# Patient Record
Sex: Male | Born: 1995 | Hispanic: Yes | Marital: Single | Smoking: Never smoker
Health system: Southern US, Community
[De-identification: ages and names within clinical notes are randomized; demographics above are authoritative.]

---

## 2020-05-08 ENCOUNTER — Encounter (HOSPITAL_BASED_OUTPATIENT_CLINIC_OR_DEPARTMENT_OTHER): Payer: Self-pay | Admitting: Emergency Medicine

## 2020-05-08 ENCOUNTER — Emergency Department (HOSPITAL_BASED_OUTPATIENT_CLINIC_OR_DEPARTMENT_OTHER)
Admission: EM | Admit: 2020-05-08 | Discharge: 2020-05-08 | Disposition: A | Discharge status: Home / Self Care | Payer: Self-pay | Attending: Emergency Medicine | Admitting: Emergency Medicine

## 2020-05-08 ENCOUNTER — Other Ambulatory Visit: Payer: Self-pay

## 2020-05-08 DIAGNOSIS — N342 Other urethritis: Secondary | ICD-10-CM | POA: Insufficient documentation

## 2020-05-08 LAB — URINALYSIS, ROUTINE W REFLEX MICROSCOPIC
Bilirubin Urine: NEGATIVE
Glucose, UA: NEGATIVE mg/dL
Hgb urine dipstick: NEGATIVE
Ketones, ur: NEGATIVE mg/dL
Leukocytes,Ua: NEGATIVE
Nitrite: NEGATIVE
Protein, ur: NEGATIVE mg/dL
Specific Gravity, Urine: <1.005 — ABNORMAL LOW (ref 1.005–1.030)
pH: 5.5 (ref 5.0–8.0)

## 2020-05-08 LAB — HIV ANTIBODY (ROUTINE TESTING W REFLEX): HIV Screen 4th Generation wRfx: NONREACTIVE

## 2020-05-08 NOTE — ED Triage Notes (Signed)
Pt would like to be tested for a STD  Pt states he has discomfort with urination

## 2020-05-08 NOTE — ED Provider Notes (Signed)
  [facility] EMERGENCY DEPARTMENT Provider Note   CSN: [id number] Arrival date & time: 05/08/20  5366     History Chief Complaint  Patient presents with  . Dysuria    Tommy Moses is a 24 y.o. male.  The history is provided by the patient.  Dysuria Presenting symptoms: dysuria   Presenting symptoms: no penile discharge, no scrotal pain and no swelling   Relieved by:  Nothing Worsened by:  Nothing Associated symptoms: no fever   Patient here for STD check        PMH-none Family History  Problem Relation Age of Onset  . Diabetes Other   . Hypertension Other     Social History   Tobacco Use  . Smoking status: Never Smoker  . Smokeless tobacco: Never Used  Vaping Use  . Vaping Use: Never used  Substance Use Topics  . Alcohol use: Never  . Drug use: Yes    Types: Marijuana    Home Medications Prior to Admission medications   Not on File    Allergies    Patient has no known allergies.  Review of Systems   Review of Systems  Constitutional: Negative for fever.  Genitourinary: Positive for dysuria. Negative for discharge and genital sores.    Physical Exam Updated Vital Signs BP 117/71 (BP Location: Right Arm)   Pulse (!) 57   Temp 98.2 F (36.8 C) (Oral)   Resp 14   Ht 1.753 m (5\' 9" )   Wt 68 kg   SpO2 100%   BMI 22.15 kg/m   Physical Exam CONSTITUTIONAL: Well developed/well nourished HEAD: Normocephalic/atraumatic EYES: EOMI ENMT: Mucous membranes moist NECK: supple no meningeal signs LUNGS:   no apparent distress ABDOMEN: soft GU:no penile lesions.  No penile discharge.  No scrotal tenderness.  Nurse present for exam NEURO: Pt is awake/alert/appropriate, moves all extremitiesx4.  No facial droop.   EXTREMITIES:   full ROM SKIN: warm, color normal PSYCH: no abnormalities of mood noted, alert and oriented to situation  ED Results / Procedures / Treatments   Labs (all labs ordered are listed, but only abnormal  results are displayed) Labs Reviewed  URINALYSIS, ROUTINE W REFLEX MICROSCOPIC  HIV ANTIBODY (ROUTINE TESTING W REFLEX)  RPR  GC/CHLAMYDIA PROBE AMP ([place]) NOT AT [facility]    EKG None  Radiology No results found.  Procedures Procedures   Medications Ordered in ED Medications - No data to display  ED Course  I have reviewed the triage vital signs and the nursing notes.     MDM Rules/Calculators/A&P                          No clinical signs present to empirically treat for STD Discussed strict safe sex practices  Final Clinical Impression(s) / ED Diagnoses Final diagnoses:  Urethritis    Rx / DC Orders ED Discharge Orders    None       [facility], MD 05/08/20 [phone]

## 2020-05-09 LAB — GC/CHLAMYDIA PROBE AMP (~~LOC~~) NOT AT ARMC
Chlamydia: NEGATIVE
Comment: NEGATIVE
Comment: NORMAL
Neisseria Gonorrhea: NEGATIVE

## 2020-05-09 LAB — RPR: RPR Ser Ql: NONREACTIVE

## 2020-07-27 ENCOUNTER — Emergency Department (HOSPITAL_BASED_OUTPATIENT_CLINIC_OR_DEPARTMENT_OTHER)
Admission: EM | Admit: 2020-07-27 | Discharge: 2020-07-27 | Disposition: A | Discharge status: Home / Self Care | Payer: Self-pay | Attending: Emergency Medicine | Admitting: Emergency Medicine

## 2020-07-27 ENCOUNTER — Other Ambulatory Visit: Payer: Self-pay

## 2020-07-27 ENCOUNTER — Encounter (HOSPITAL_BASED_OUTPATIENT_CLINIC_OR_DEPARTMENT_OTHER): Payer: Self-pay

## 2020-07-27 DIAGNOSIS — A749 Chlamydial infection, unspecified: Secondary | ICD-10-CM | POA: Insufficient documentation

## 2020-07-27 DIAGNOSIS — Z202 Contact with and (suspected) exposure to infections with a predominantly sexual mode of transmission: Secondary | ICD-10-CM | POA: Insufficient documentation

## 2020-07-27 MED ORDER — CEFTRIAXONE SODIUM 500 MG IJ SOLR: 500.0000 mg | Freq: Once | INTRAMUSCULAR | Status: AC

## 2020-07-27 MED ORDER — AZITHROMYCIN 250 MG PO TABS: 1000.0000 mg | ORAL_TABLET | Freq: Once | ORAL | Status: AC

## 2020-07-27 MED ORDER — LIDOCAINE HCL (PF) 1 % IJ SOLN: 1.0000 mL | Freq: Once | INTRAMUSCULAR | Status: DC

## 2020-07-27 MED ORDER — AZITHROMYCIN 250 MG PO TABS
ORAL_TABLET | ORAL | Status: AC
  Administered 2020-07-27: 1000 mg via ORAL
  Filled 2020-07-27: qty 4

## 2020-07-27 MED ORDER — CEFTRIAXONE SODIUM 500 MG IJ SOLR
INTRAMUSCULAR | Status: AC
  Administered 2020-07-27: 500 mg via INTRAMUSCULAR
  Filled 2020-07-27: qty 500

## 2020-07-27 NOTE — ED Notes (Signed)
ED Provider at bedside. 

## 2020-07-27 NOTE — ED Triage Notes (Addendum)
Pt requesting treatment for STD-has copy of independent lab results in hand-NAD-steady gait

## 2020-07-27 NOTE — Discharge Instructions (Addendum)
For sexual intercourse until cleared by your physicians.  Contact sexual partners you have had while you had symptoms due to concern for possible infection as well.  Return if you have fevers discharge worsening symptoms or any additional concerns.

## 2020-07-27 NOTE — ED Provider Notes (Signed)
[facility] EMERGENCY DEPARTMENT Provider Note   CSN: [id number] Arrival date & time: 07/27/20  1233     History Chief Complaint  Patient presents with  . SEXUALLY TRANSMITTED DISEASE    Tommy Moses is a 25 y.o. male.  Patient presents with concern for STD.  He states that he has had some discharge and irritation in the penile region over the course of the last 2 or 3 days.  He went to an outside clinic and had multiple tests done.  He states that he was positive for chlamydia and sent to the ER for treatment.  Denies any pain or discharge at this time.  No fever no vomiting or cough or diarrhea.  He states he has not had sexual intercourse within the past week.        History reviewed. No pertinent past medical history.  There are no problems to display for this patient.   History reviewed. No pertinent surgical history.     Family History  Problem Relation Age of Onset  . Diabetes Other   . Hypertension Other     Social History   Tobacco Use  . Smoking status: Never Smoker  . Smokeless tobacco: Never Used  Vaping Use  . Vaping Use: Never used  Substance Use Topics  . Alcohol use: Never  . Drug use: Yes    Types: Marijuana    Home Medications Prior to Admission medications   Not on File    Allergies    Patient has no known allergies.  Review of Systems   Review of Systems  Constitutional: Negative for fever.  HENT: Negative for ear pain and sore throat.   Eyes: Negative for pain.  Respiratory: Negative for cough.   Cardiovascular: Negative for chest pain.  Gastrointestinal: Negative for abdominal pain.  Genitourinary: Negative for flank pain.  Musculoskeletal: Negative for back pain.  Skin: Negative for color change and rash.  Neurological: Negative for syncope.  All other systems reviewed and are negative.   Physical Exam Updated Vital Signs BP 115/75 (BP Location: Left Arm)   Pulse (!) 55   Temp 98.5 F (36.9 C) (Oral)    Resp 16   Ht 5\' 9"  (1.753 m)   Wt 64.9 kg   SpO2 100%   BMI 21.12 kg/m   Physical Exam Constitutional:      General: He is not in acute distress.    Appearance: He is well-developed.  HENT:     Head: Normocephalic.     Nose: Nose normal.  Eyes:     Extraocular Movements: Extraocular movements intact.  Cardiovascular:     Rate and Rhythm: Normal rate.  Pulmonary:     Effort: Pulmonary effort is normal.  Genitourinary:    Penis: Normal.      Comments: No ulcerations lesions or discharge noted. Skin:    Coloration: Skin is not jaundiced.  Neurological:     Mental Status: He is alert. Mental status is at baseline.     ED Results / Procedures / Treatments   Labs (all labs ordered are listed, but only abnormal results are displayed) Labs Reviewed - No data to display  EKG None  Radiology No results found.  Procedures Procedures (including critical care time)  Medications Ordered in ED Medications  azithromycin (ZITHROMAX) tablet 1,000 mg (has no administration in time range)  cefTRIAXone (ROCEPHIN) injection 500 mg (has no administration in time range)  lidocaine (PF) (XYLOCAINE) 1 % injection 1 mL (has no administration  in time range)  azithromycin (ZITHROMAX) 250 MG tablet (has no administration in time range)  cefTRIAXone (ROCEPHIN) 500 MG injection (has no administration in time range)    ED Course  I have reviewed the triage vital signs and the nursing notes.  Pertinent labs & imaging results that were available during my care of the patient were reviewed by me and considered in my medical decision making (see chart for details).    MDM Rules/Calculators/A&P                          Patient given a gram of azithromycin here and 500 mg of Rocephin.  Advise no intercourse until cleared by his doctors.  Advised him to inform his partners of his positive status.   Final Clinical Impression(s) / ED Diagnoses Final diagnoses:  Chlamydia    Rx / DC  Orders ED Discharge Orders    None       Cheryll Cockayne, MD 07/27/20 1438

## 2020-08-04 ENCOUNTER — Encounter (HOSPITAL_BASED_OUTPATIENT_CLINIC_OR_DEPARTMENT_OTHER): Payer: Self-pay

## 2020-08-04 ENCOUNTER — Emergency Department (HOSPITAL_BASED_OUTPATIENT_CLINIC_OR_DEPARTMENT_OTHER)
Admission: EM | Admit: 2020-08-04 | Discharge: 2020-08-04 | Disposition: A | Discharge status: Home / Self Care | Payer: Self-pay | Attending: Emergency Medicine | Admitting: Emergency Medicine

## 2020-08-04 ENCOUNTER — Other Ambulatory Visit: Payer: Self-pay

## 2020-08-04 DIAGNOSIS — L0291 Cutaneous abscess, unspecified: Secondary | ICD-10-CM

## 2020-08-04 DIAGNOSIS — L0231 Cutaneous abscess of buttock: Secondary | ICD-10-CM | POA: Insufficient documentation

## 2020-08-04 MED ORDER — DOXYCYCLINE HYCLATE 100 MG PO CAPS: 100.0000 mg | ORAL_CAPSULE | Freq: Two times a day (BID) | ORAL | 0 refills | Status: DC

## 2020-08-04 NOTE — ED Triage Notes (Signed)
Pt states he has an abscess to crease between buttocks and leg. States his lymph node is swelling in his left groin. Denies fever

## 2020-08-04 NOTE — Discharge Instructions (Signed)
Please read the attached information.  You have an abscess.  Please drink plenty of water use warm compresses 4 times per day.  Please follow-up with your primary care doctor.  If you do not have a primary care doctor please follow-up with [facility] or call to establish care with a primary care doctor.

## 2020-08-04 NOTE — ED Provider Notes (Signed)
[facility] EMERGENCY DEPARTMENT Provider Note   CSN: [id number] Arrival date & time: 08/04/20  1827     History Chief Complaint  Patient presents with  . Abscess    Tommy Moses is a 25 y.o. male.  HPI Patient is 25 year old male with no past medical history presented today with an abscess/skin lesion to the crease between his buttocks and his leg right and into the gluteus muscle.  He states that he has noticed over the past 2 or 3 days.  He denies any other symptoms.  He states that it hurts when he sits on it.  He states that he has shaved this area before.  No concerns for STI.  He states that he has not had any penile discharge penile swelling or pain.  He states it is aching constant and dull.  No other associated symptoms.      History reviewed. No pertinent past medical history.  There are no problems to display for this patient.   History reviewed. No pertinent surgical history.     Family History  Problem Relation Age of Onset  . Diabetes Other   . Hypertension Other     Social History   Tobacco Use  . Smoking status: Never Smoker  . Smokeless tobacco: Never Used  Vaping Use  . Vaping Use: Never used  Substance Use Topics  . Alcohol use: Never  . Drug use: Yes    Types: Marijuana    Home Medications Prior to Admission medications   Medication Sig Start Date End Date Taking? Authorizing Provider  doxycycline (VIBRAMYCIN) 100 MG capsule Take 1 capsule (100 mg total) by mouth 2 (two) times daily. 08/04/20  Yes Gailen Shelter, PA    Allergies    Patient has no known allergies.  Review of Systems   Review of Systems  Constitutional: Negative for chills and fever.  HENT: Negative for congestion.   Respiratory: Negative for shortness of breath.   Cardiovascular: Negative for chest pain.  Gastrointestinal: Negative for abdominal pain.  Musculoskeletal: Negative for neck pain.  Skin: Positive for wound.    Physical Exam Updated Vital  Signs BP 125/82 (BP Location: Left Arm)   Pulse (!) 59   Temp 98.6 F (37 C) (Oral)   Resp 18   Ht 5\' 9"  (1.753 m)   Wt 68 kg   SpO2 100%   BMI 22.15 kg/m   Physical Exam Vitals and nursing note reviewed.  Constitutional:      General: He is not in acute distress.    Appearance: Normal appearance. He is not ill-appearing.  HENT:     Head: Normocephalic and atraumatic.  Eyes:     General: No scleral icterus.       Right eye: No discharge.        Left eye: No discharge.     Conjunctiva/sclera: Conjunctivae normal.  Pulmonary:     Effort: Pulmonary effort is normal.     Breath sounds: No stridor.  Skin:    Comments: Small nonfluctuant draining abscess to the left gluteal skin lesion 1-2cm in diameter Some scant surrounding induration.  Neurological:     Mental Status: He is alert and oriented to person, place, and time. Mental status is at baseline.     ED Results / Procedures / Treatments   Labs (all labs ordered are listed, but only abnormal results are displayed) Labs Reviewed - No data to display  EKG None  Radiology No results found.  Procedures  Procedures (including critical care time)  Medications Ordered in ED Medications - No data to display  ED Course  I have reviewed the triage vital signs and the nursing notes.  Pertinent labs & imaging results that were available during my care of the patient were reviewed by me and considered in my medical decision making (see chart for details).    MDM Rules/Calculators/A&P                          25 year old male with abscess that is draining presently.  Very well-appearing no vital sign abnormalities.  Will discharge with doxycycline and warm compresses.  Return precautions given he will follow-up with PCP or return to ER in 2 days for wound check   Final Clinical Impression(s) / ED Diagnoses Final diagnoses:  Abscess    Rx / DC Orders ED Discharge Orders         Ordered    doxycycline  (VIBRAMYCIN) 100 MG capsule  2 times daily        08/04/20 2211           Solon Augusta [place], [place] 08/04/20 2215    Terald Sleeper, MD 08/05/20 [phone]

## 2021-02-26 ENCOUNTER — Encounter (HOSPITAL_BASED_OUTPATIENT_CLINIC_OR_DEPARTMENT_OTHER): Payer: Self-pay

## 2021-02-26 ENCOUNTER — Emergency Department (HOSPITAL_BASED_OUTPATIENT_CLINIC_OR_DEPARTMENT_OTHER)
Admission: EM | Admit: 2021-02-26 | Discharge: 2021-02-26 | Disposition: A | Discharge status: Home / Self Care | Payer: Self-pay | Attending: Emergency Medicine | Admitting: Emergency Medicine

## 2021-02-26 ENCOUNTER — Other Ambulatory Visit: Payer: Self-pay

## 2021-02-26 DIAGNOSIS — L02212 Cutaneous abscess of back [any part, except buttock]: Secondary | ICD-10-CM | POA: Insufficient documentation

## 2021-02-26 DIAGNOSIS — L03317 Cellulitis of buttock: Secondary | ICD-10-CM | POA: Insufficient documentation

## 2021-02-26 DIAGNOSIS — L0231 Cutaneous abscess of buttock: Secondary | ICD-10-CM

## 2021-02-26 MED ORDER — DOXYCYCLINE HYCLATE 100 MG PO CAPS: 100.0000 mg | ORAL_CAPSULE | Freq: Two times a day (BID) | ORAL | 0 refills | Status: DC

## 2021-02-26 NOTE — ED Notes (Signed)
Patient with abscess to left inner buttock/upper thigh, patient states he "popped" it at home and it has been draining.  Upon inspection it has been draining already, no appreciable swelling, does not appear to have any subcutaneous pus pocket.

## 2021-02-26 NOTE — ED Triage Notes (Signed)
Pt c/o abscess to left buttock x 1 week-NAD-steady gait

## 2021-02-26 NOTE — ED Provider Notes (Signed)
[facility] EMERGENCY DEPARTMENT Provider Note   CSN: [id number] Arrival date & time: 02/26/21  1143     History Chief Complaint  Patient presents with   Abscess    Tommy Moses is a 25 y.o. male.   Abscess Associated symptoms: no fever   Patient has recurrent buttock and left abscess.  Says for the last week it has been more painful and more swollen.  States usually it will drain.  Sometimes he will poke it half a drain on its own.  No fevers.  States this 1 is last longer.  Has been there for around a week.  No fevers or chills.  States he drained it at home and has been draining some but has not gone away like it usually does.    History reviewed. No pertinent past medical history.  There are no problems to display for this patient.   History reviewed. No pertinent surgical history.     Family History  Problem Relation Age of Onset   Diabetes Other    Hypertension Other     Social History   Tobacco Use   Smoking status: Never   Smokeless tobacco: Never  Vaping Use   Vaping Use: Never used  Substance Use Topics   Alcohol use: Never   Drug use: Yes    Types: Marijuana    Home Medications Prior to Admission medications   Medication Sig Start Date End Date Taking? Authorizing Provider  doxycycline (VIBRAMYCIN) 100 MG capsule Take 1 capsule (100 mg total) by mouth 2 (two) times daily. 02/26/21   Benjiman Core, MD    Allergies    Patient has no known allergies.  Review of Systems   Review of Systems  Constitutional:  Negative for appetite change and fever.  Respiratory:  Negative for shortness of breath.   Cardiovascular:  Negative for chest pain.  Gastrointestinal:  Negative for abdominal pain.  Musculoskeletal:  Negative for back pain.  Skin:  Positive for wound.  Neurological:  Negative for weakness.   Physical Exam Updated Vital Signs BP 122/85 (BP Location: Left Arm)   Pulse (!) 53   Temp 98.3 F (36.8 C) (Oral)   Resp 16   Ht  5\' 7"  (1.702 m)   Wt 67.6 kg   SpO2 100%   BMI 23.34 kg/m   Physical Exam Vitals and nursing note reviewed.  Constitutional:      Appearance: Normal appearance.  HENT:     Head: Atraumatic.  Cardiovascular:     Rate and Rhythm: Regular rhythm.  Skin:    General: Skin is warm.     Comments: Left posterior buttock area inferiorly has approximately 1 cm tender somewhat indurated area.  No active drainage.  No fluctuance.  Neurological:     Mental Status: He is alert and oriented to person, place, and time.    ED Results / Procedures / Treatments   Labs (all labs ordered are listed, but only abnormal results are displayed) Labs Reviewed - No data to display  EKG None  Radiology No results found.  Procedures Procedures   Medications Ordered in ED Medications - No data to display  ED Course  I have reviewed the triage vital signs and the nursing notes.  Pertinent labs & imaging results that were available during my care of the patient were reviewed by me and considered in my medical decision making (see chart for details).    MDM Rules/Calculators/A&P  Patient with recent abscess of buttock.  Had drained it on his own at home.  Do not see any fluid collection.  With continued induration we will add some antibiotics.  Discharge home.  Outpatient follow-up as needed. Final Clinical Impression(s) / ED Diagnoses Final diagnoses:  Cellulitis and abscess of buttock    Rx / DC Orders ED Discharge Orders          Ordered    doxycycline (VIBRAMYCIN) 100 MG capsule  2 times daily        02/26/21 1232             Benjiman Core, MD 02/26/21 1236

## 2021-07-28 ENCOUNTER — Encounter (HOSPITAL_BASED_OUTPATIENT_CLINIC_OR_DEPARTMENT_OTHER): Payer: Self-pay

## 2021-07-28 ENCOUNTER — Emergency Department (HOSPITAL_BASED_OUTPATIENT_CLINIC_OR_DEPARTMENT_OTHER): Payer: Self-pay

## 2021-07-28 ENCOUNTER — Emergency Department (HOSPITAL_BASED_OUTPATIENT_CLINIC_OR_DEPARTMENT_OTHER)
Admission: EM | Admit: 2021-07-28 | Discharge: 2021-07-28 | Disposition: A | Discharge status: Home / Self Care | Payer: Self-pay | Attending: Emergency Medicine | Admitting: Emergency Medicine

## 2021-07-28 ENCOUNTER — Other Ambulatory Visit: Payer: Self-pay

## 2021-07-28 DIAGNOSIS — W540XXA Bitten by dog, initial encounter: Secondary | ICD-10-CM

## 2021-07-28 DIAGNOSIS — S61452A Open bite of left hand, initial encounter: Secondary | ICD-10-CM | POA: Insufficient documentation

## 2021-07-28 DIAGNOSIS — S61451A Open bite of right hand, initial encounter: Secondary | ICD-10-CM | POA: Insufficient documentation

## 2021-07-28 DIAGNOSIS — Z203 Contact with and (suspected) exposure to rabies: Secondary | ICD-10-CM | POA: Insufficient documentation

## 2021-07-28 DIAGNOSIS — W541XXA Struck by dog, initial encounter: Secondary | ICD-10-CM | POA: Insufficient documentation

## 2021-07-28 DIAGNOSIS — Z23 Encounter for immunization: Secondary | ICD-10-CM | POA: Insufficient documentation

## 2021-07-28 DIAGNOSIS — Z2914 Encounter for prophylactic rabies immune globin: Secondary | ICD-10-CM | POA: Insufficient documentation

## 2021-07-28 MED ORDER — RABIES IMMUNE GLOBULIN 150 UNIT/ML IM INJ
20.0000 [IU]/kg | INJECTION | Freq: Once | INTRAMUSCULAR | Status: AC
  Administered 2021-07-28: 1350 [IU] via INTRAMUSCULAR
  Filled 2021-07-28: qty 10

## 2021-07-28 MED ORDER — TETANUS-DIPHTH-ACELL PERTUSSIS 5-2.5-18.5 LF-MCG/0.5 IM SUSY
0.5000 mL | PREFILLED_SYRINGE | Freq: Once | INTRAMUSCULAR | Status: AC
  Administered 2021-07-28: 0.5 mL via INTRAMUSCULAR
  Filled 2021-07-28: qty 0.5

## 2021-07-28 MED ORDER — IBUPROFEN 400 MG PO TABS
600.0000 mg | ORAL_TABLET | Freq: Once | ORAL | Status: AC
  Administered 2021-07-28: 600 mg via ORAL
  Filled 2021-07-28: qty 1

## 2021-07-28 MED ORDER — AMOXICILLIN-POT CLAVULANATE 875-125 MG PO TABS
1.0000 | ORAL_TABLET | Freq: Once | ORAL | Status: AC
  Administered 2021-07-28: 1 via ORAL
  Filled 2021-07-28: qty 1

## 2021-07-28 MED ORDER — AMOXICILLIN-POT CLAVULANATE 875-125 MG PO TABS: 1.0000 | ORAL_TABLET | Freq: Two times a day (BID) | ORAL | 0 refills | Status: DC

## 2021-07-28 MED ORDER — RABIES VACCINE, PCEC IM SUSR
1.0000 mL | Freq: Once | INTRAMUSCULAR | Status: AC
  Administered 2021-07-28: 1 mL via INTRAMUSCULAR
  Filled 2021-07-28: qty 1

## 2021-07-28 NOTE — ED Provider Notes (Signed)
[facility] EMERGENCY DEPARTMENT Provider Note   CSN: [id number] Arrival date & time: 07/28/21  2111     History  Chief Complaint  Patient presents with   Animal Bite    Tommy Moses is a 26 y.o. male.  HPI  Patient presents with animal bite.  It was a dog in his neighborhood, he does not know the dog or the vaccination status or have any ability to follow-up on the dog.  Unsure when his last tetanus was.  He has bites to both hands, painful and feels like his hand is swollen.  He is able to move all the fingers, no paresthesias.  Moving makes the pain worse.  Home Medications Prior to Admission medications   Medication Sig Start Date End Date Taking? Authorizing Provider  amoxicillin-clavulanate (AUGMENTIN) 875-125 MG tablet Take 1 tablet by mouth every 12 (twelve) hours. 07/28/21  Yes Theron Arista, PA-C  doxycycline (VIBRAMYCIN) 100 MG capsule Take 1 capsule (100 mg total) by mouth 2 (two) times daily. 02/26/21   Benjiman Core, MD      Allergies    Patient has no known allergies.    Review of Systems   Review of Systems  Skin:  Positive for wound.   Physical Exam Updated Vital Signs BP 131/87 (BP Location: Right Arm)    Pulse 88    Temp 98.4 F (36.9 C) (Oral)    Resp 18    Ht 5\' 8"  (1.727 m)    Wt 68 kg    SpO2 100%    BMI 22.81 kg/m  Physical Exam Vitals and nursing note reviewed. Exam conducted with a chaperone present.  Constitutional:      General: He is not in acute distress.    Appearance: Normal appearance.  HENT:     Head: Normocephalic and atraumatic.  Eyes:     General: No scleral icterus.    Extraocular Movements: Extraocular movements intact.     Pupils: Pupils are equal, round, and reactive to light.  Cardiovascular:     Pulses: Normal pulses.  Musculoskeletal:        General: Tenderness present. Normal range of motion.  Skin:    Capillary Refill: Capillary refill takes less than 2 seconds.     Coloration: Skin is not jaundiced.   Neurological:     Mental Status: He is alert. Mental status is at baseline.     Coordination: Coordination normal.           ED Results / Procedures / Treatments   Labs (all labs ordered are listed, but only abnormal results are displayed) Labs Reviewed - No data to display  EKG None  Radiology DG Hand Complete Left  Result Date: 07/28/2021 CLINICAL DATA:  Recent dog bite, initial encounter EXAM: LEFT HAND - COMPLETE 3+ VIEW COMPARISON:  None. FINDINGS: No acute fracture or dislocation is noted. Mild soft tissue irregularity is noted in the third digit distally consistent with the recent injury. IMPRESSION: No acute bony abnormality noted. Electronically Signed   By: 07/30/2021 M.D.   On: 07/28/2021 21:55   DG Hand Complete Right  Result Date: 07/28/2021 CLINICAL DATA:  Recent dog bite EXAM: RIGHT HAND - COMPLETE 3+ VIEW COMPARISON:  None. FINDINGS: There is no evidence of fracture or dislocation. There is no evidence of arthropathy or other focal bone abnormality. Soft tissues are unremarkable. IMPRESSION: No acute abnormality noted. Electronically Signed   By: 07/30/2021 M.D.   On: 07/28/2021 21:56  Procedures Procedures    Medications Ordered in ED Medications  rabies immune globulin (HYPERAB/KEDRAB) injection 1,350 Units (1,350 Units Intramuscular Given 07/28/21 2315)  rabies vaccine (RABAVERT) injection 1 mL (1 mL Intramuscular Given 07/28/21 2312)  amoxicillin-clavulanate (AUGMENTIN) 875-125 MG per tablet 1 tablet (1 tablet Oral Given 07/28/21 2311)  Tdap (BOOSTRIX) injection 0.5 mL (0.5 mLs Intramuscular Given 07/28/21 2314)  ibuprofen (ADVIL) tablet 600 mg (600 mg Oral Given 07/28/21 2311)    ED Course/ Medical Decision Making/ A&P                           Medical Decision Making  Is a 26 year old male presenting due to animal bite.  Vitals are stable, he is not febrile.    Neurovascularly intact, wounds are as detailed in the physical exam.  He has good  pulses and cap refill, no surrounding erythema.  Given the risk of infection will not close the wound.    I personally reviewed the radiographs ordered during triage and agree with the radiologist interpretation.  No fractures or acute bony findings.  Patient irrigated with copious fluids, tetanus updated.  Will initiate rabies vaccination series and prescribe Augmentin.  Strict return precautions as well as signs of infections were discussed with the patient.        Final Clinical Impression(s) / ED Diagnoses Final diagnoses:  Dog bite, initial encounter    Rx / DC Orders ED Discharge Orders          Ordered    amoxicillin-clavulanate (AUGMENTIN) 875-125 MG tablet  Every 12 hours        07/28/21 2249              Theron Arista, PA-C 07/28/21 2318    Tegeler, Canary Brim, MD 07/28/21 [phone]

## 2021-07-28 NOTE — ED Triage Notes (Signed)
Pt arrives with reports of being attacked by a dog tonight while out walking, reports cleaning out with peroxide and and alcohol. Did not know dog, unsure of vaccine status. Pt is unsure of his last tetanus shot. Has puncture wound to top of right hand, between index and middle finger on right and to left fingers.

## 2021-07-28 NOTE — ED Notes (Signed)
PT wounds cleaned and dressed prior to discharge.

## 2021-07-28 NOTE — Discharge Instructions (Addendum)
°                                  RABIES VACCINE FOLLOW UP  Patient's Name: Tommy Moses                     Original Order Date:07/28/2021  Medical Record Number: [id number]  ED Physician: Heide Scales, * Primary Diagnosis: Rabies Exposure       PCP: Patient, No Pcp Per (Inactive)  Patient Phone Number: (home) [phone] (home)    (cell)  Telephone Information:  Mobile [phone]    (work) There is no work phone number on file. Species of Animal:     You have been seen in the Emergency Department for a possible rabies exposure. It's very important you return for the additional vaccine doses.  Please call the clinic listed below for hours of operation.   Clinic that will administer your rabies vaccines:    DAY 0:  07/28/2021      DAY 3:  07/31/2021       DAY 7:  08/04/2021     DAY 14:  08/11/2021         The 5th vaccine injection is considered for immune compromised patients only.  DAY 28:  08/25/2021      Take Augmentin twice daily for the next 10 days.  You were given the first dose tonight in the ED.  Continue the antibiotics until completely done.  Wash wound with soap and water, watch for signs of spreading redness, pus, fevers as these can indicate infections.  Vaccination schedule to review listed below, you will need to return to the ED for future use as scheduled.

## 2021-07-28 NOTE — ED Notes (Signed)
Irrigated pt wounds to bilateral hands with ns.

## 2021-11-06 ENCOUNTER — Encounter (HOSPITAL_BASED_OUTPATIENT_CLINIC_OR_DEPARTMENT_OTHER): Payer: Self-pay | Admitting: Emergency Medicine

## 2021-11-06 ENCOUNTER — Other Ambulatory Visit: Payer: Self-pay

## 2021-11-06 ENCOUNTER — Emergency Department (HOSPITAL_BASED_OUTPATIENT_CLINIC_OR_DEPARTMENT_OTHER)
Admission: EM | Admit: 2021-11-06 | Discharge: 2021-11-06 | Disposition: A | Discharge status: Home / Self Care | Payer: Self-pay | Attending: Emergency Medicine | Admitting: Emergency Medicine

## 2021-11-06 DIAGNOSIS — A749 Chlamydial infection, unspecified: Secondary | ICD-10-CM

## 2021-11-06 MED ORDER — DOXYCYCLINE HYCLATE 100 MG PO CAPS: 100.0000 mg | ORAL_CAPSULE | Freq: Two times a day (BID) | ORAL | 0 refills | Status: AC

## 2021-11-06 NOTE — Discharge Instructions (Signed)
You were seen today for a sexually transmitted infection. Please take the full course of prescribed antibiotics. Do not have sex until you have completed treatment. Any recent partners should be treated.  ?

## 2021-11-06 NOTE — ED Triage Notes (Signed)
Pt arrives pov, steady gait with c/o dysuria, abscess on penis, tested positive for chlamydia x 2 days. Pta. Denies d/c ?

## 2021-11-06 NOTE — ED Provider Notes (Signed)
?  [facility] EMERGENCY DEPARTMENT ?Provider Note ? ? ?CSN: [id number] ?Arrival date & time: 11/06/21  0846 ? ?  ? ?History ? ?Chief Complaint  ?Patient presents with  ? SEXUALLY TRANSMITTED DISEASE  ? ? ?Tommy Moses is a 26 y.o. male.  Patient presents to the emergency department complaining of a chlamydia infection.  Patient was Tested outpatient and received a positive result this morning. Patient requests antibiotic therapy. Patient was negative for  Gonorrhea, syphilis, HSV. Patient has no other complaints at this time. ? ?HPI ? ?  ? ?Home Medications ?Prior to Admission medications   ?Medication Sig Start Date End Date Taking? Authorizing Provider  ?doxycycline (VIBRAMYCIN) 100 MG capsule Take 1 capsule (100 mg total) by mouth 2 (two) times daily for 7 days. 11/06/21 11/13/21 Yes Darrick Grinder, PA-C  ?amoxicillin-clavulanate (AUGMENTIN) 875-125 MG tablet Take 1 tablet by mouth every 12 (twelve) hours. 07/28/21   Theron Arista, PA-C  ?doxycycline (VIBRAMYCIN) 100 MG capsule Take 1 capsule (100 mg total) by mouth 2 (two) times daily. 02/26/21   Benjiman Core, MD  ?   ? ?Allergies    ?Patient has no known allergies.   ? ?Review of Systems   ?Review of Systems  ?Genitourinary:  Positive for penile discharge.  ? ?Physical Exam ?Updated Vital Signs ?BP (!) 144/87   Pulse 65   Temp 98.2 ?F (36.8 ?C) (Oral)   Resp 18   Ht 5\' 8"  (1.727 m)   Wt 68 kg   SpO2 98%   BMI 22.81 kg/m?  ?Physical Exam ?Vitals and nursing note reviewed.  ?Constitutional:   ?   General: He is not in acute distress. ?HENT:  ?   Head: Normocephalic.  ?Eyes:  ?   Conjunctiva/sclera: Conjunctivae normal.  ?Genitourinary: ?   Comments: Patient deferred exam ?Musculoskeletal:  ?   Cervical back: Normal range of motion.  ?Neurological:  ?   Mental Status: He is alert.  ? ? ?ED Results / Procedures / Treatments   ?Labs ?(all labs ordered are listed, but only abnormal results are displayed) ?Labs Reviewed - No data to  display ? ?EKG ?None ? ?Radiology ?No results found. ? ?Procedures ?Procedures  ? ? ?Medications Ordered in ED ?Medications - No data to display ? ?ED Course/ Medical Decision Making/ A&P ?  ?                        ?Medical Decision Making ? ?The has a positive Chlamydia test result.  Plan to discharge home with antibiotic therapy. ? ? ?Final Clinical Impression(s) / ED Diagnoses ?Final diagnoses:  ?Chlamydia  ? ? ?Rx / DC Orders ?ED Discharge Orders   ? ?      Ordered  ?  doxycycline (VIBRAMYCIN) 100 MG capsule  2 times daily       ? 11/06/21 0919  ? ?  ?  ? ?  ? ? ?  ?11/08/21, PA-C ?11/06/21 0919 ? ?  ?11/08/21, MD ?11/06/21 1656 ? ?

## 2021-11-13 ENCOUNTER — Encounter (HOSPITAL_BASED_OUTPATIENT_CLINIC_OR_DEPARTMENT_OTHER): Payer: Self-pay

## 2021-11-13 ENCOUNTER — Other Ambulatory Visit: Payer: Self-pay

## 2021-11-13 ENCOUNTER — Emergency Department (HOSPITAL_BASED_OUTPATIENT_CLINIC_OR_DEPARTMENT_OTHER)
Admission: EM | Admit: 2021-11-13 | Discharge: 2021-11-13 | Disposition: A | Discharge status: Home / Self Care | Payer: Self-pay | Attending: Emergency Medicine | Admitting: Emergency Medicine

## 2021-11-13 DIAGNOSIS — Z113 Encounter for screening for infections with a predominantly sexual mode of transmission: Secondary | ICD-10-CM | POA: Insufficient documentation

## 2021-11-13 NOTE — ED Notes (Signed)
Pt provided discharge paperwork. No further questions at this time, NAD at time of departure ?

## 2021-11-13 NOTE — ED Provider Notes (Signed)
?[facility] EMERGENCY DEPARTMENT ?Provider Note ? ? ?CSN: [id number] ?Arrival date & time: 11/13/21  1302 ? ?  ? ?History ? ?Chief Complaint  ?Patient presents with  ? Follow-up  ? ? ?Deloyd Handy is a 26 y.o. male. ? ?Patient with no pertinent past medical history presents today requesting STD recheck. States that he just completed course of doxycycline for chlamydia after testing positive on 4/26 and is here today for test of cure. He states that originally he was experiencing dysuria and penile discharge and same has subsequently resolved. He is now completely asymptomatic and without any continuing concerns or complaints. ? ?The history is provided by the patient. No language interpreter was used.  ? ?  ? ?Home Medications ?Prior to Admission medications   ?Medication Sig Start Date End Date Taking? Authorizing Provider  ?amoxicillin-clavulanate (AUGMENTIN) 875-125 MG tablet Take 1 tablet by mouth every 12 (twelve) hours. 07/28/21   Theron Arista, PA-C  ?doxycycline (VIBRAMYCIN) 100 MG capsule Take 1 capsule (100 mg total) by mouth 2 (two) times daily. 02/26/21   Benjiman Core, MD  ?doxycycline (VIBRAMYCIN) 100 MG capsule Take 1 capsule (100 mg total) by mouth 2 (two) times daily for 7 days. 11/06/21 11/13/21  Darrick Grinder, PA-C  ?   ? ?Allergies    ?Patient has no known allergies.   ? ?Review of Systems   ?Review of Systems  ?Constitutional:  Negative for chills and fever.  ?Genitourinary:  Negative for genital sores, hematuria, penile discharge and penile pain.  ?All other systems reviewed and are negative. ? ?Physical Exam ?Updated Vital Signs ?BP 132/86 (BP Location: Left Arm)   Pulse 67   Temp 98 ?F (36.7 ?C) (Oral)   Resp 16   Ht 5\' 8"  (1.727 m)   Wt 68 kg   BMI 22.81 kg/m?  ?Physical Exam ?Vitals and nursing note reviewed.  ?Constitutional:   ?   General: He is not in acute distress. ?   Appearance: Normal appearance. He is normal weight. He is not ill-appearing, toxic-appearing or  diaphoretic.  ?HENT:  ?   Head: Normocephalic and atraumatic.  ?Cardiovascular:  ?   Rate and Rhythm: Normal rate.  ?Pulmonary:  ?   Effort: Pulmonary effort is normal. No respiratory distress.  ?Musculoskeletal:     ?   General: Normal range of motion.  ?   Cervical back: Normal range of motion.  ?Skin: ?   General: Skin is warm and dry.  ?Neurological:  ?   General: No focal deficit present.  ?   Mental Status: He is alert.  ?Psychiatric:     ?   Mood and Affect: Mood normal.     ?   Behavior: Behavior normal.  ? ? ?ED Results / Procedures / Treatments   ?Labs ?(all labs ordered are listed, but only abnormal results are displayed) ?Labs Reviewed  ?GC/CHLAMYDIA PROBE AMP ([facility]) NOT AT [facility]  ? ? ?EKG ?None ? ?Radiology ?No results found. ? ?Procedures ?Procedures  ? ? ?Medications Ordered in ED ?Medications - No data to display ? ?ED Course/ Medical Decision Making/ A&P ?  ?                        ?Medical Decision Making ? ?Patient presents today for test of cure following treatment for chlamydia.  He is afebrile, nontoxic-appearing, and in no acute distress reassuring vital signs.  Urine GC/chlamydia collected, patient understanding that same takes 24 to  48 hours to result educated on how to review results on MyChart.  Advised to await results before resuming sexual activity.  Patient understanding and amenable with plan, educated on red flag symptoms of prompt immediate return.  Discharged stable condition. ? ?Final Clinical Impression(s) / ED Diagnoses ?Final diagnoses:  ?Screen for STD (sexually transmitted disease)  ? ? ?Rx / DC Orders ?ED Discharge Orders   ? ? None  ? ?  ?An After Visit Summary was printed and given to the patient. ? ? ?  ?Silva Bandy, PA-C ?11/13/21 1406 ? ?  ?Sloan Leiter, DO ?11/14/21 1629 ? ?

## 2021-11-13 NOTE — Discharge Instructions (Signed)
As we discussed, you will need to await results of your urine sample collected today before resuming sexual activity.  Please monitor your MyChart online for these results. ? ?Return if development of any new or worsening symptoms. ?

## 2021-11-13 NOTE — ED Triage Notes (Addendum)
Pt states he was treated for chlamydia on 4/26. Pt finished his course of antibiotics and he wants to be retested to know if he is safe to have intercourse again. Pt denies symptoms and reports no illness today.  ?

## 2022-02-02 ENCOUNTER — Other Ambulatory Visit: Payer: Self-pay

## 2022-02-02 ENCOUNTER — Emergency Department (HOSPITAL_BASED_OUTPATIENT_CLINIC_OR_DEPARTMENT_OTHER)
Admission: EM | Admit: 2022-02-02 | Discharge: 2022-02-02 | Disposition: A | Discharge status: Home / Self Care | Payer: Self-pay | Attending: Emergency Medicine | Admitting: Emergency Medicine

## 2022-02-02 ENCOUNTER — Encounter (HOSPITAL_BASED_OUTPATIENT_CLINIC_OR_DEPARTMENT_OTHER): Payer: Self-pay | Admitting: Emergency Medicine

## 2022-02-02 DIAGNOSIS — L739 Follicular disorder, unspecified: Secondary | ICD-10-CM | POA: Insufficient documentation

## 2022-02-02 NOTE — ED Triage Notes (Signed)
Pt reports a recurrent abscess to R groin x 1 day. Pt states he noted small amount of drainage from the area. No fevers.

## 2022-02-02 NOTE — ED Provider Notes (Signed)
[facility] EMERGENCY DEPARTMENT Provider Note   CSN: [id number] Arrival date & time: 02/02/22  0540     History  Chief Complaint  Patient presents with   Abscess    Tommy Moses is a 26 y.o. male.  The history is provided by the patient.  Abscess Abscess location: right groin. Abscess quality: not draining, no induration and not painful   Abscess quality comment:  Slight elevation of hair follicle with scab today Red streaking: no   Duration: hours. Progression:  Unchanged Chronicity:  New Context: not diabetes   Relieved by:  Nothing Worsened by:  Nothing Ineffective treatments:  None tried Associated symptoms: no anorexia and no fever   Risk factors: prior abscess   Risk factors: no family hx of MRSA        Home Medications Prior to Admission medications   Medication Sig Start Date End Date Taking? Authorizing Provider  amoxicillin-clavulanate (AUGMENTIN) 875-125 MG tablet Take 1 tablet by mouth every 12 (twelve) hours. 07/28/21   Theron Arista, PA-C  doxycycline (VIBRAMYCIN) 100 MG capsule Take 1 capsule (100 mg total) by mouth 2 (two) times daily. 02/26/21   Benjiman Core, MD      Allergies    Patient has no known allergies.    Review of Systems   Review of Systems  Constitutional:  Negative for fever.  HENT:  Negative for ear discharge.   Respiratory:  Negative for wheezing and stridor.   Gastrointestinal:  Negative for anorexia.  All other systems reviewed and are negative.   Physical Exam Updated Vital Signs BP 128/71 (BP Location: Right Arm)   Pulse (!) 58   Temp 98.5 F (36.9 C) (Oral)   Resp 20   Ht 5\' 8"  (1.727 m)   Wt 73.9 kg   SpO2 100%   BMI 24.78 kg/m  Physical Exam Vitals and nursing note reviewed. Exam conducted with a chaperone present.  Constitutional:      General: He is not in acute distress.    Appearance: He is well-developed. He is not diaphoretic.  HENT:     Head: Normocephalic and atraumatic.     Nose: Nose  normal.  Eyes:     Conjunctiva/sclera: Conjunctivae normal.     Pupils: Pupils are equal, round, and reactive to light.  Cardiovascular:     Rate and Rhythm: Normal rate and regular rhythm.  Pulmonary:     Effort: Pulmonary effort is normal.     Breath sounds: Normal breath sounds. No wheezing or rales.  Abdominal:     General: Bowel sounds are normal.     Palpations: Abdomen is soft.     Tenderness: There is no abdominal tenderness. There is no guarding or rebound.  Genitourinary:   Musculoskeletal:        General: Normal range of motion.     Cervical back: Normal range of motion and neck supple.  Skin:    General: Skin is warm and dry.     Capillary Refill: Capillary refill takes less than 2 seconds.  Neurological:     General: No focal deficit present.     Mental Status: He is alert and oriented to person, place, and time.  Psychiatric:        Mood and Affect: Mood normal.        Behavior: Behavior normal.     ED Results / Procedures / Treatments   Labs (all labs ordered are listed, but only abnormal results are displayed) Labs Reviewed - No  data to display  EKG None  Radiology No results found.  Procedures Procedures    Medications Ordered in ED Medications - No data to display  ED Course/ Medical Decision Making/ A&P                           Medical Decision Making Concern for abscess as he noted a hraised hair follicle while showering  Amount and/or Complexity of Data Reviewed External Data Reviewed: notes.    Details: previous ED notes reviewed  Risk Risk Details: I have performed an ultrasound of the area and there is no abscess pocket at this time. There is no cellulitis.  Patient has shaved in that region and it appears to be a follicle with scab.  I recommend no further shaving, bacitracin ointment and wearing boxer shorts.  Strict return precautions given.      Final Clinical Impression(s) / ED Diagnoses Final diagnoses:  Disorder of hair  follicle     Return for intractable cough, coughing up blood, fevers > 100.4 unrelieved by medication, shortness of breath, intractable vomiting, chest pain, shortness of breath, weakness, numbness, changes in speech, facial asymmetry, abdominal pain, passing out, Inability to tolerate liquids or food, cough, altered mental status or any concerns. No signs of systemic illness or infection. The patient is nontoxic-appearing on exam and vital signs are within normal limits.  I have reviewed the triage vital signs and the nursing notes. Pertinent labs & imaging results that were available during my care of the patient were reviewed by me and considered in my medical decision making (see chart for details). After history, exam, and medical workup I feel the patient has been appropriately medically screened and is safe for discharge home. Pertinent diagnoses were discussed with the patient. Patient was given return precautions.  Rx / DC Orders ED Discharge Orders     None         Brodin Gelpi, MD 02/02/22 [phone]

## 2022-02-02 NOTE — Discharge Instructions (Signed)
Use bacitracin or neosporin on the area

## 2022-11-08 ENCOUNTER — Other Ambulatory Visit: Payer: Self-pay

## 2022-11-08 ENCOUNTER — Encounter (HOSPITAL_BASED_OUTPATIENT_CLINIC_OR_DEPARTMENT_OTHER): Payer: Self-pay | Admitting: *Deleted

## 2022-11-08 ENCOUNTER — Emergency Department (HOSPITAL_BASED_OUTPATIENT_CLINIC_OR_DEPARTMENT_OTHER)
Admission: EM | Admit: 2022-11-08 | Discharge: 2022-11-08 | Disposition: A | Discharge status: Home / Self Care | Payer: Medicaid Other | Attending: Emergency Medicine | Admitting: Emergency Medicine

## 2022-11-08 DIAGNOSIS — J340 Abscess, furuncle and carbuncle of nose: Secondary | ICD-10-CM | POA: Diagnosis not present

## 2022-11-08 MED ORDER — LIDOCAINE-EPINEPHRINE (PF) 2 %-1:200000 IJ SOLN
10.0000 mL | Freq: Once | INTRAMUSCULAR | Status: AC
  Administered 2022-11-08: 10 mL
  Filled 2022-11-08: qty 20

## 2022-11-08 MED ORDER — CLINDAMYCIN HCL 150 MG PO CAPS: 150.0000 mg | ORAL_CAPSULE | Freq: Four times a day (QID) | ORAL | 0 refills | Status: AC

## 2022-11-08 MED ORDER — IBUPROFEN 400 MG PO TABS
400.0000 mg | ORAL_TABLET | Freq: Once | ORAL | Status: DC
  Filled 2022-11-08: qty 1

## 2022-11-08 MED ORDER — CLINDAMYCIN HCL 150 MG PO CAPS
150.0000 mg | ORAL_CAPSULE | Freq: Once | ORAL | Status: AC
  Administered 2022-11-08: 150 mg via ORAL
  Filled 2022-11-08: qty 1

## 2022-11-08 MED ORDER — ACETAMINOPHEN 325 MG PO TABS
650.0000 mg | ORAL_TABLET | Freq: Once | ORAL | Status: DC
  Filled 2022-11-08: qty 2

## 2022-11-08 NOTE — ED Notes (Signed)
Patient reports increased pain after procedure.  Requesting pain medication prior to d/c

## 2022-11-08 NOTE — ED Triage Notes (Signed)
Pt reports that he has swelling to his nose.  Pt states that he thinks this began with a pimple in his nose, this has been ongoing for 3 days.  No fever or chills

## 2022-11-08 NOTE — ED Notes (Signed)
Pt is not in room. 

## 2022-11-08 NOTE — Discharge Instructions (Addendum)
Apply warm compresses.  Take ibuprofen and/or acetaminophen as needed for pain.  Please be aware that when you combine ibuprofen and acetaminophen, you get better pain relief than you get from taking either medication by itself.  Return if you start running a fever, pain is increasing, or if swelling is increasing.

## 2022-11-08 NOTE — ED Provider Notes (Signed)
[place] EMERGENCY DEPARTMENT AT [facility] Provider Note   CSN: [id number] Arrival date & time: 11/08/22  0230     History  Chief Complaint  Patient presents with   Facial Swelling    Tommy Moses is a 27 y.o. male.  The history is provided by the patient.  He has had a pimple on the right side of his nose for the last 3 days.  It seemed to come up when he blew his nose.  It is painful and he is now starting to have swelling going into his face and pain radiating back towards his ear.  He thinks his lymph nodes might be enlarged.  He denies fever, chills, sweats.   Home Medications Prior to Admission medications   Medication Sig Start Date End Date Taking? Authorizing Provider  amoxicillin-clavulanate (AUGMENTIN) 875-125 MG tablet Take 1 tablet by mouth every 12 (twelve) hours. 07/28/21   Theron Arista, PA-C  doxycycline (VIBRAMYCIN) 100 MG capsule Take 1 capsule (100 mg total) by mouth 2 (two) times daily. 02/26/21   Benjiman Core, MD      Allergies    Patient has no known allergies.    Review of Systems   Review of Systems  All other systems reviewed and are negative.   Physical Exam Updated Vital Signs BP 119/81   Pulse 82   Temp 98.5 F (36.9 C) (Oral)   Resp 18   SpO2 98%  Physical Exam Vitals and nursing note reviewed.   27 year old male, resting comfortably and in no acute distress. Vital signs are normal. Oxygen saturation is 98%, which is normal. Head is normocephalic and atraumatic. PERRLA, EOMI. There is a small abscess which is within the right nostril.  There is slight soft tissue swelling and erythema of the skin of the face but no induration. Neck is nontender and supple without adenopathy. Lungs are clear without rales, wheezes, or rhonchi. Chest is nontender. Heart has regular rate and rhythm without murmur. Abdomen is soft, flat, nontender. Skin is warm and dry without other rash. Neurologic: Awake and alert, moves all extremities  equally.  ED Results / Procedures / Treatments    Procedures .Marland KitchenIncision and Drainage  Date/Time: 11/08/2022 6:53 AM  Performed by: Dione Booze, MD Authorized by: Dione Booze, MD   Consent:    Consent obtained:  Verbal   Consent given by:  Patient   Risks, benefits, and alternatives were discussed: yes     Risks discussed:  Bleeding, pain and incomplete drainage   Alternatives discussed:  Alternative treatment Universal protocol:    Procedure explained and questions answered to patient or proxy's satisfaction: yes     Relevant documents present and verified: yes     Required blood products, implants, devices, and special equipment available: yes     Site/side marked: yes     Immediately prior to procedure, a time out was called: yes     Patient identity confirmed:  Verbally with patient and arm band Location:    Type:  Abscess   Location:  Head   Head location:  Nose Pre-procedure details:    Skin preparation:  Antiseptic wash Sedation:    Sedation type:  None Anesthesia:    Anesthesia method:  Local infiltration   Local anesthetic:  Lidocaine 2% WITH epi Procedure type:    Complexity:  Simple Procedure details:    Ultrasound guidance: no     Needle aspiration: yes     Drainage:  Purulent  Drainage amount:  Scant   Packing materials:  None Post-procedure details:    Procedure completion:  Tolerated well, no immediate complications     Medications Ordered in ED Medications  clindamycin (CLEOCIN) capsule 150 mg (has no administration in time range)  lidocaine-EPINEPHrine (XYLOCAINE W/EPI) 2 %-1:200000 (PF) injection 10 mL (10 mLs Infiltration Given 11/08/22 1610)    ED Course/ Medical Decision Making/ A&P                             Medical Decision Making Risk Prescription drug management.   Nasal abscess.  Under local anesthesia, I aspirated the abscess with an 18-gauge needle getting a small amount of pus, but there was still significant swelling  present.  I have ordered a dose of clindamycin and I am discharging him with a prescription for clindamycin and a referral to ENT.  Return precautions discussed.  Final Clinical Impression(s) / ED Diagnoses Final diagnoses:  Abscess of nose    Rx / DC Orders ED Discharge Orders          Ordered    clindamycin (CLEOCIN) 150 MG capsule  Every 6 hours        11/08/22 9604              Dione Booze, MD 11/08/22 [phone]

## 2023-11-02 IMAGING — DX DG HAND COMPLETE 3+V*R*
3 series · 3 of 3 positions shown · non-contrast
Comparison: None.

CLINICAL DATA: Recent dog bite

EXAM:
RIGHT HAND - COMPLETE 3+ VIEW

[hand pa]
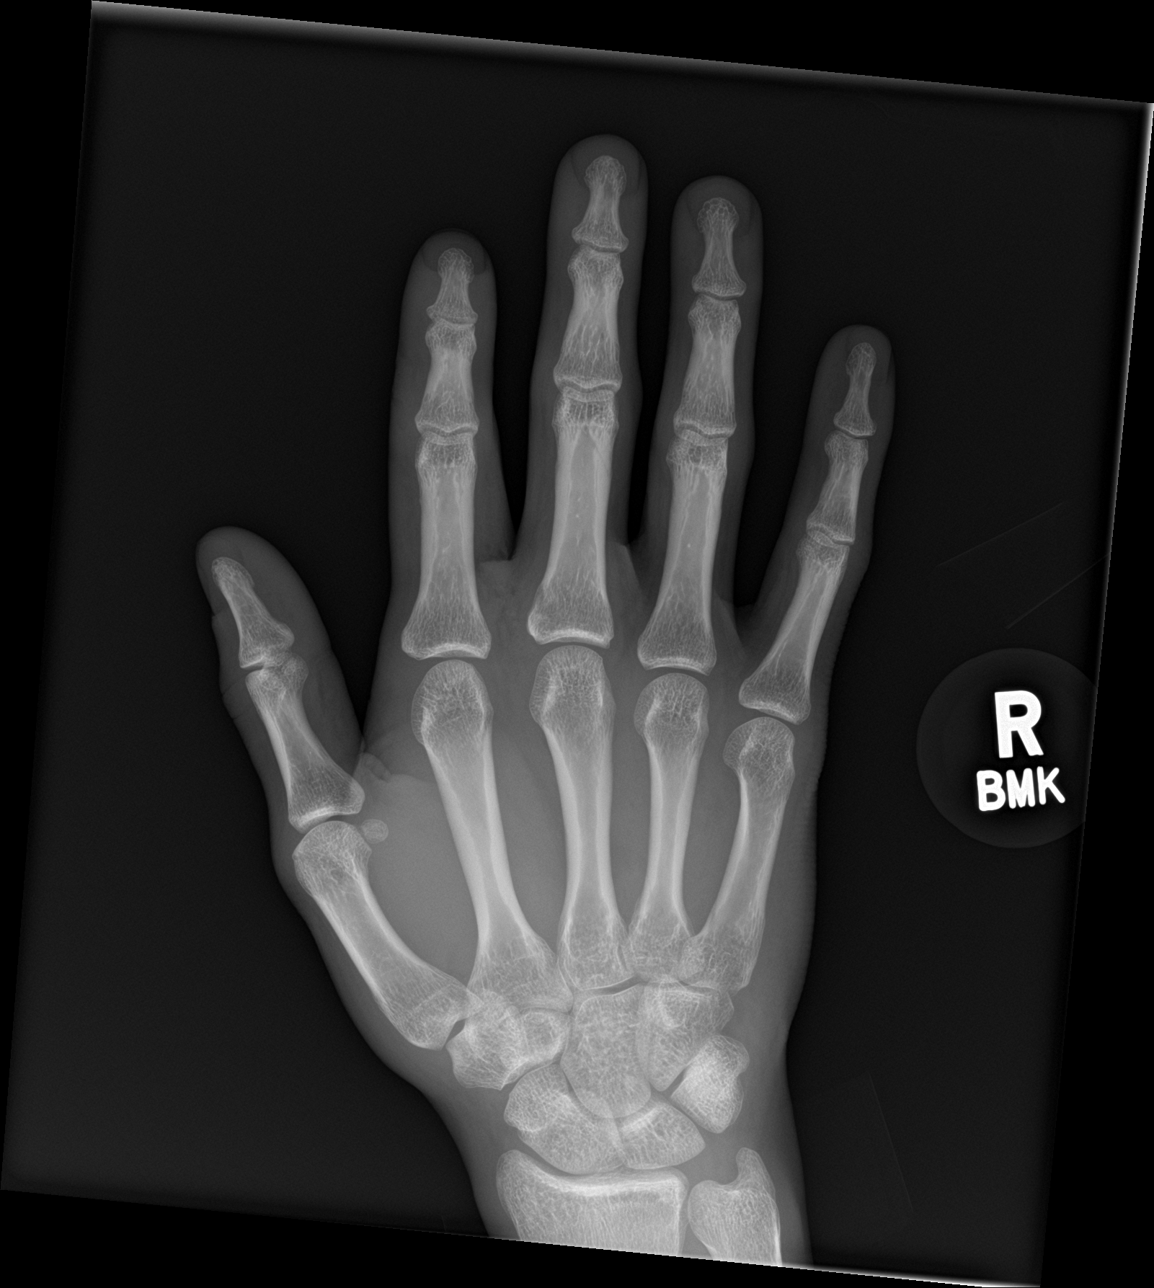

[hand obl]
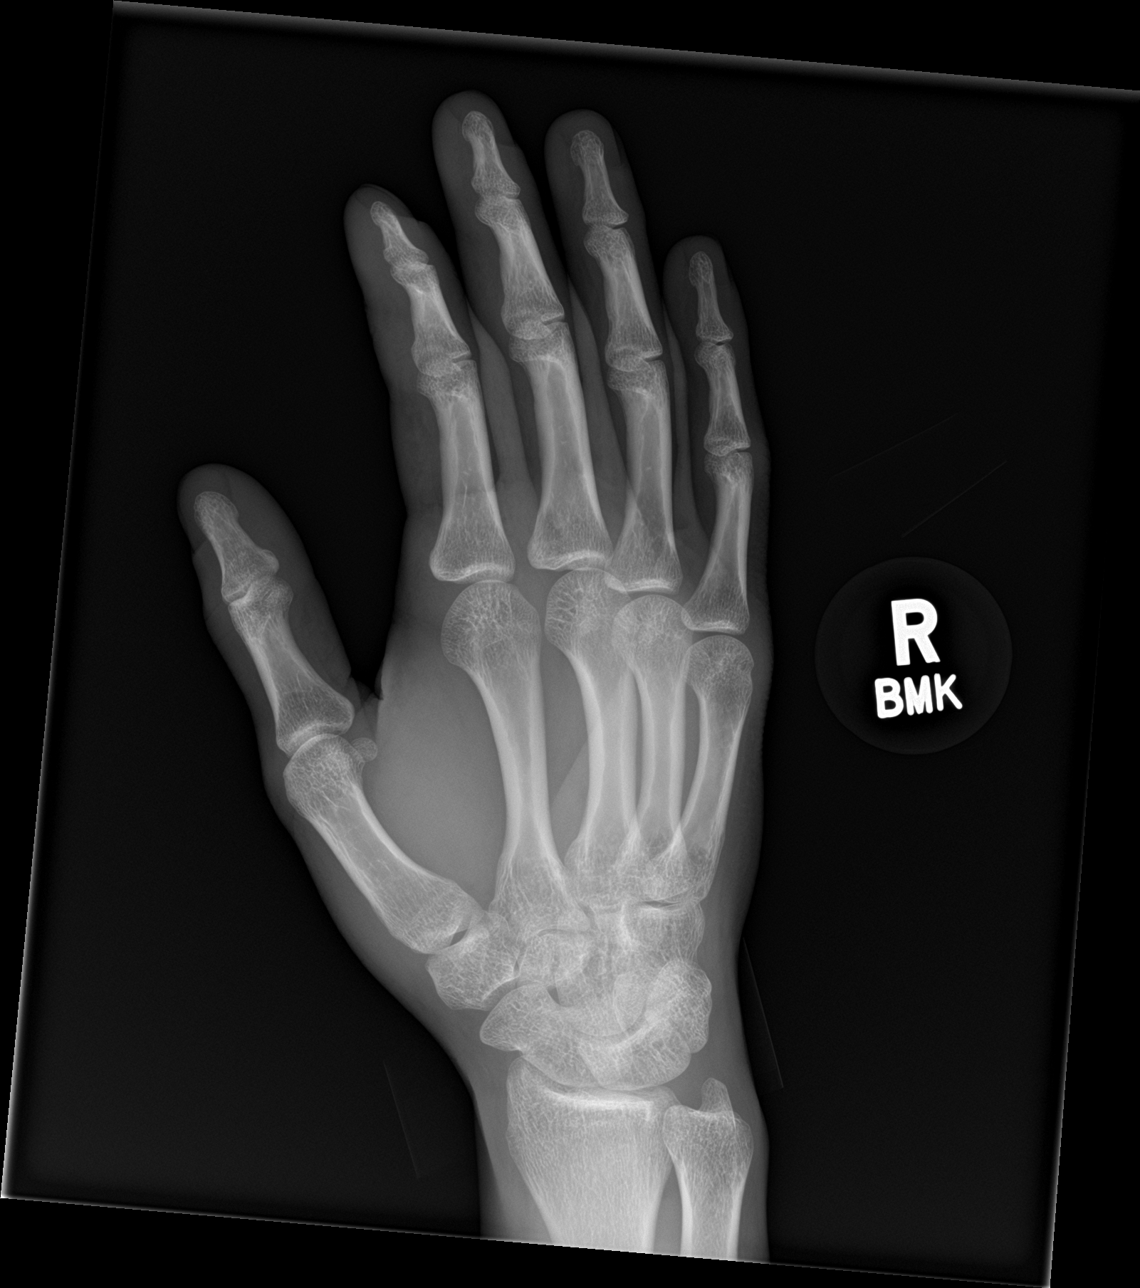

[hand lat]
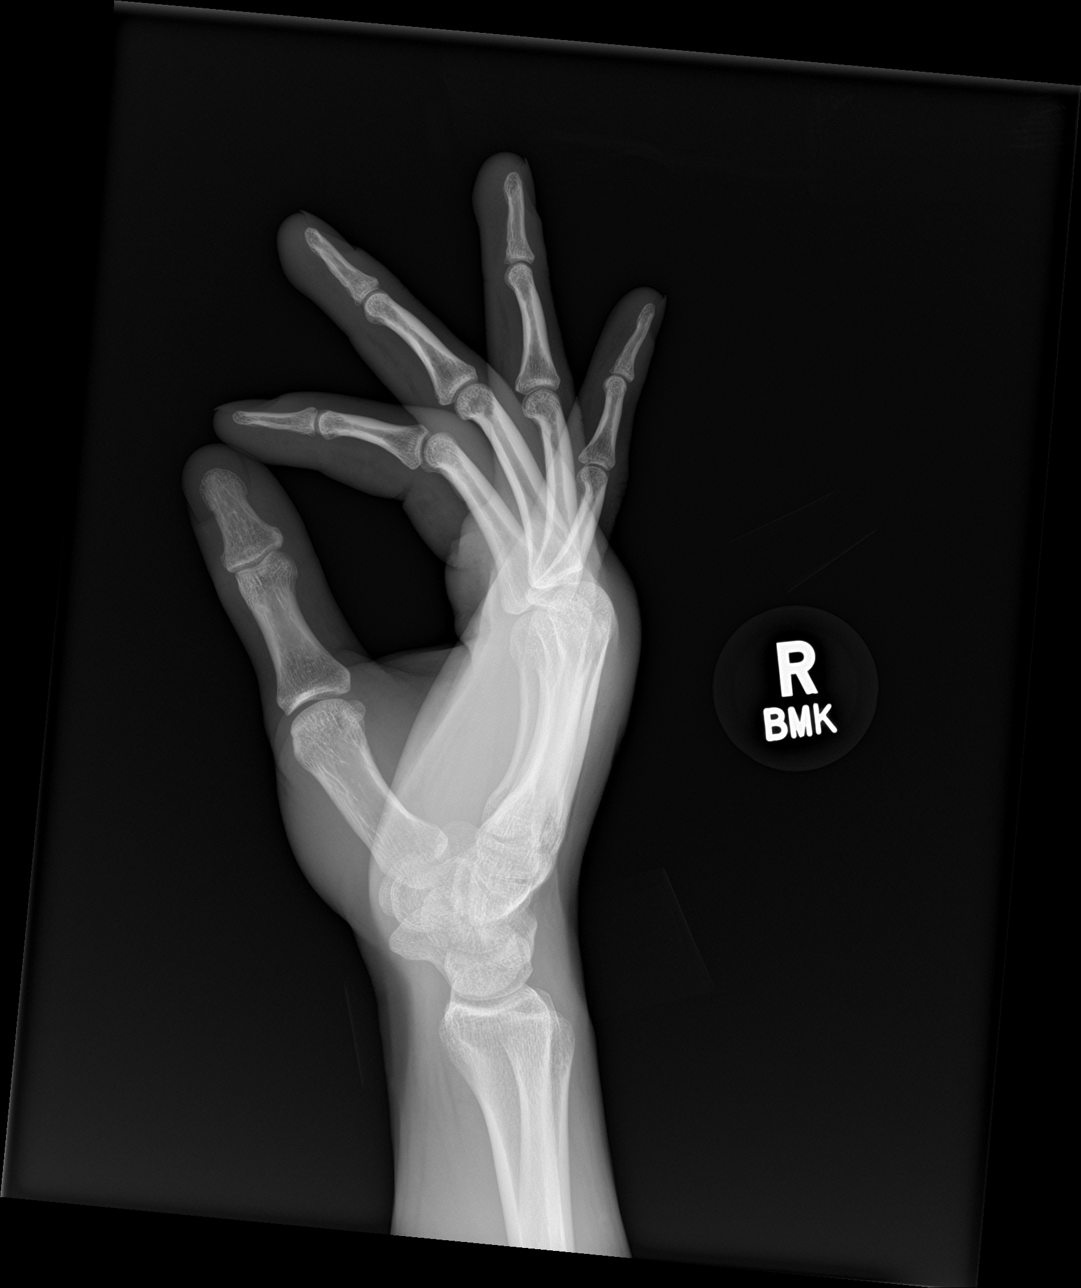

[3 of 3 positions shown; findings below may reference images not displayed]

FINDINGS: There is no evidence of fracture or dislocation. There is no
evidence of arthropathy or other focal bone abnormality. Soft
tissues are unremarkable.
IMPRESSION: No acute abnormality noted.
